# Patient Record
Sex: Male | Born: 1935 | Race: White | Hispanic: No | Marital: Married | Smoking: Former smoker
Health system: Southern US, Community
[De-identification: ages and names within clinical notes are randomized; demographics above are authoritative.]

## PROBLEM LIST (undated history)

## (undated) DIAGNOSIS — N400 Enlarged prostate without lower urinary tract symptoms: Secondary | ICD-10-CM

## (undated) DIAGNOSIS — R269 Unspecified abnormalities of gait and mobility: Secondary | ICD-10-CM

## (undated) DIAGNOSIS — E785 Hyperlipidemia, unspecified: Secondary | ICD-10-CM

## (undated) DIAGNOSIS — I639 Cerebral infarction, unspecified: Secondary | ICD-10-CM

## (undated) DIAGNOSIS — R569 Unspecified convulsions: Secondary | ICD-10-CM

## (undated) HISTORY — PX: ANAL FISSURE REPAIR: SHX2312

## (undated) HISTORY — DX: Unspecified convulsions: R56.9

## (undated) HISTORY — DX: Hyperlipidemia, unspecified: E78.5

## (undated) HISTORY — DX: Unspecified abnormalities of gait and mobility: R26.9

## (undated) HISTORY — DX: Cerebral infarction, unspecified: I63.9

## (undated) HISTORY — DX: Benign prostatic hyperplasia without lower urinary tract symptoms: N40.0

## (undated) HISTORY — PX: CHOLECYSTECTOMY: SHX55

## (undated) HISTORY — PX: HERNIA REPAIR: SHX51

---

## 2006-10-18 ENCOUNTER — Encounter: Admission: RE | Admit: 2006-10-18 | Discharge: 2007-01-16 | Payer: Self-pay | Admitting: *Deleted

## 2007-07-12 ENCOUNTER — Emergency Department (HOSPITAL_COMMUNITY): Admission: EM | Admit: 2007-07-12 | Discharge: 2007-07-13 | Payer: Self-pay | Admitting: Emergency Medicine

## 2008-10-19 IMAGING — CT CT HEAD W/O CM
1 series · 16 of 30 positions shown, 20 images · non-contrast
Comparison: None available

CLINICAL DATA: Seizure

CT HEAD WITHOUT CONTRAST
TECHNIQUE: Contiguous axial images were obtained from the base of
the skull through the vertex without contrast.

[Series 2: (id) head 4.8 h37s st · axial · 0.46mm/px · z∈[-185,-56]mm · 16 of 30 slices shown, 20 images]
[im 2/30  brain]
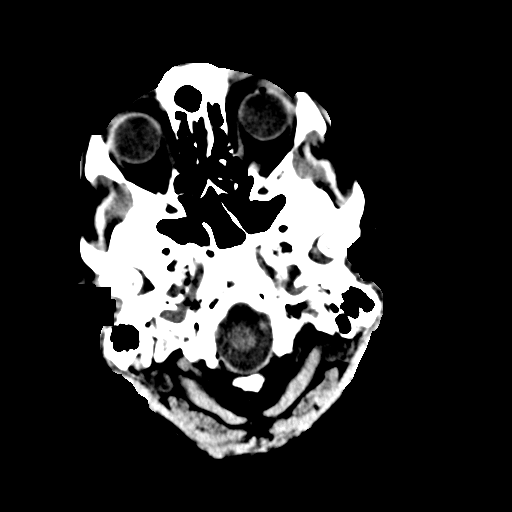
[im 2/30  bone]
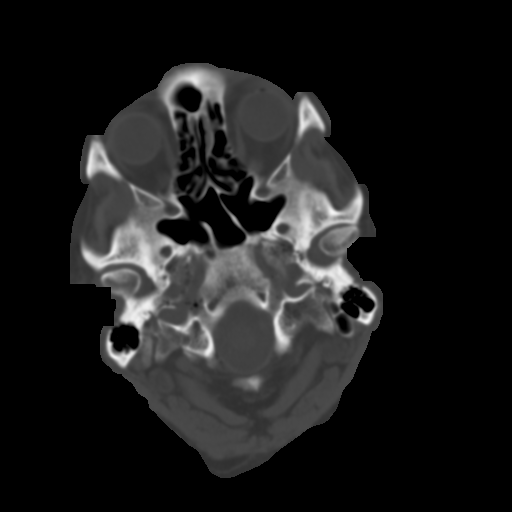
[im 4/30  brain]
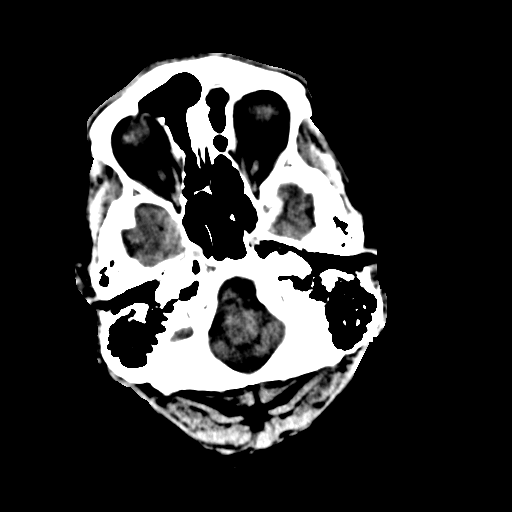
[im 6/30  brain]
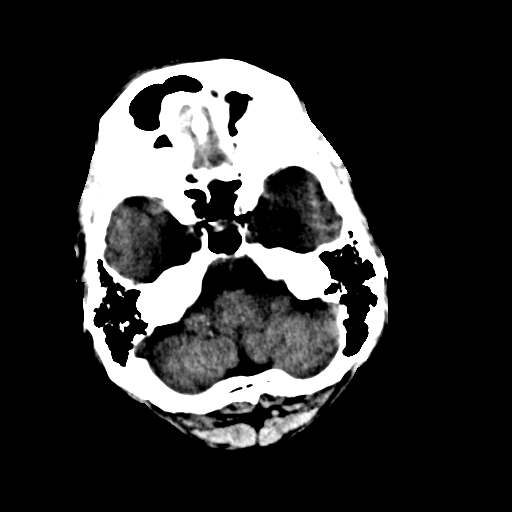
[im 8/30  brain]
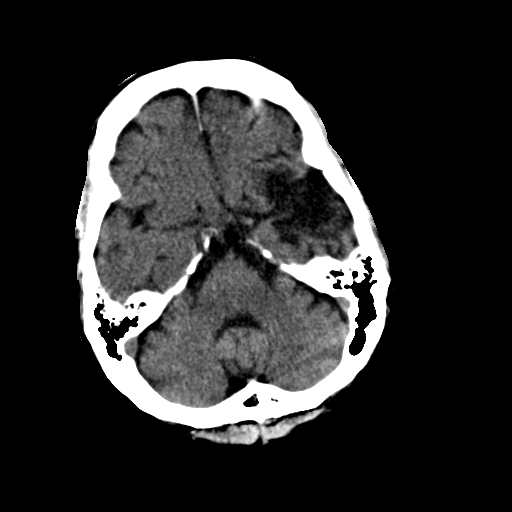
[im 9/30  brain]
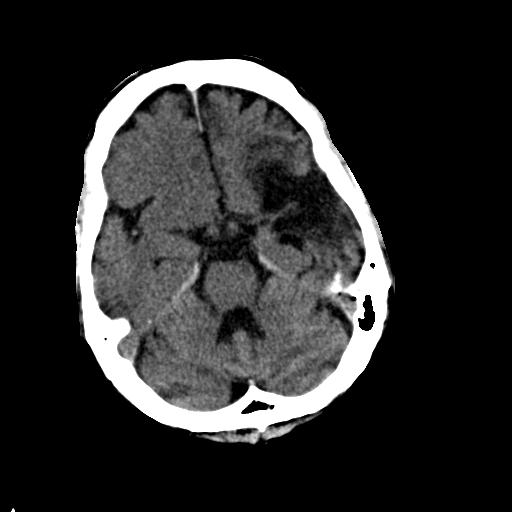
[im 9/30  bone]
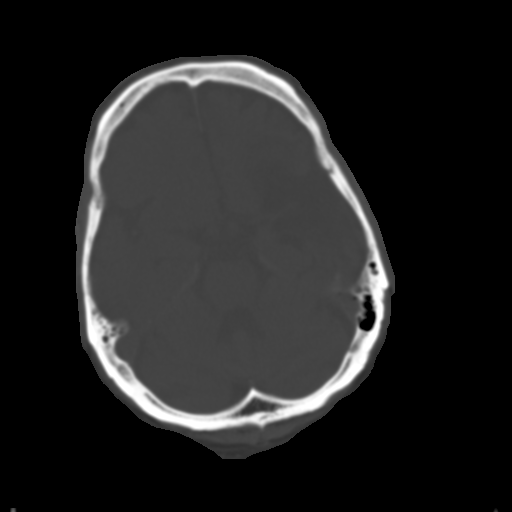
[im 11/30  brain]
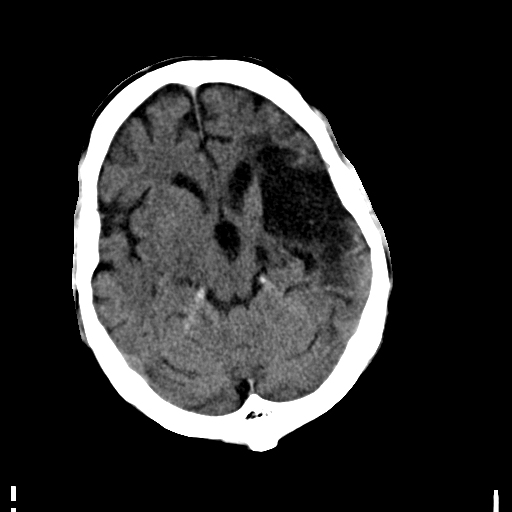
[im 13/30  brain]
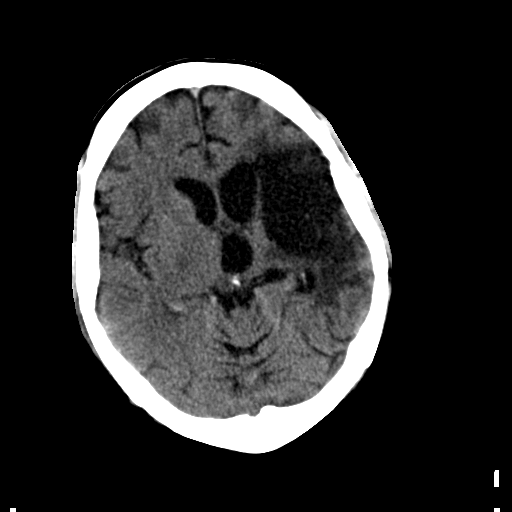
[im 15/30  brain]
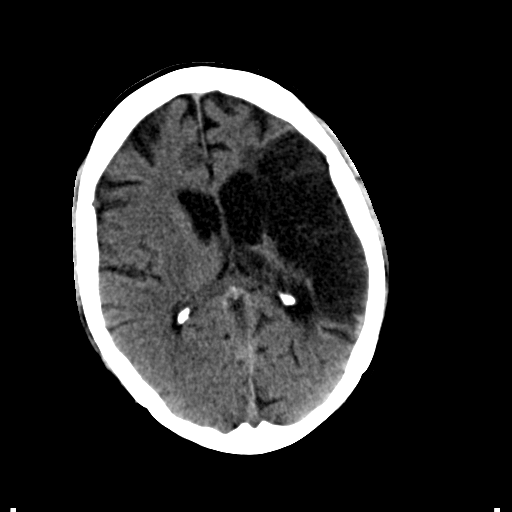
[im 16/30  brain]
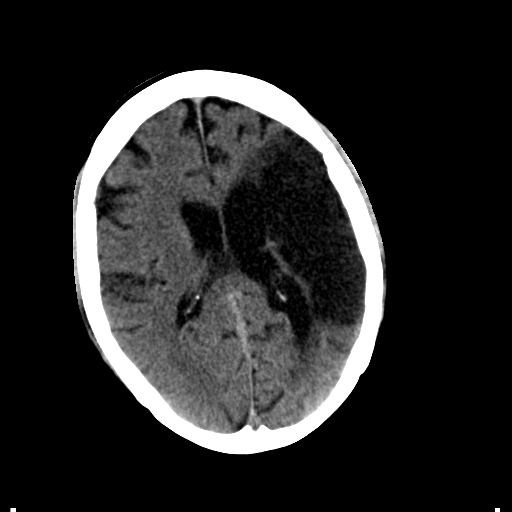
[im 16/30  bone]
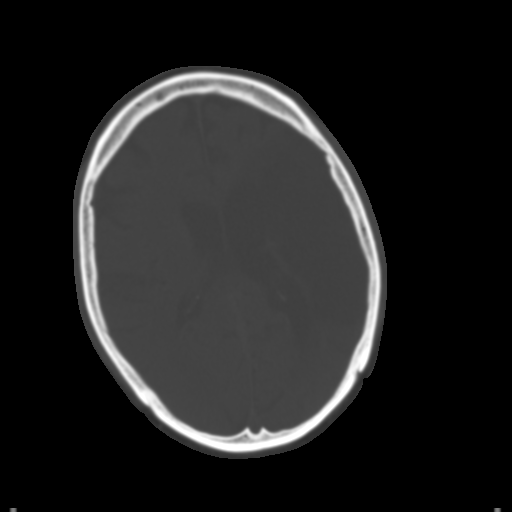
[im 18/30  brain]
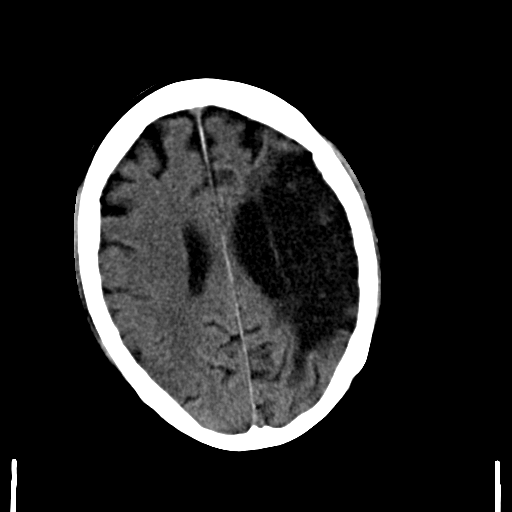
[im 20/30  brain]
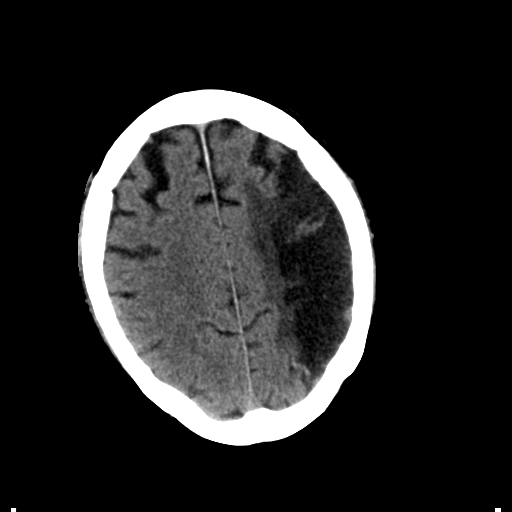
[im 22/30  brain]
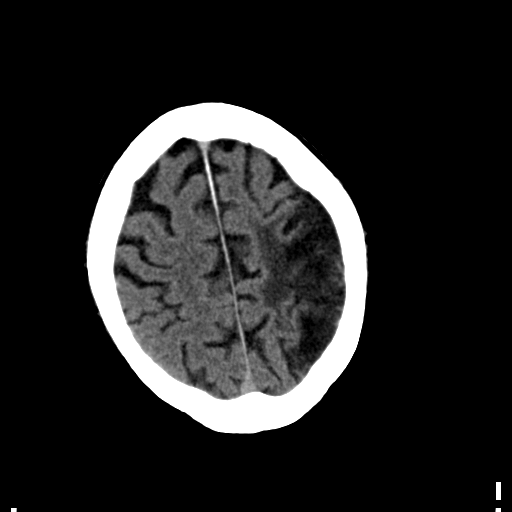
[im 23/30  brain]
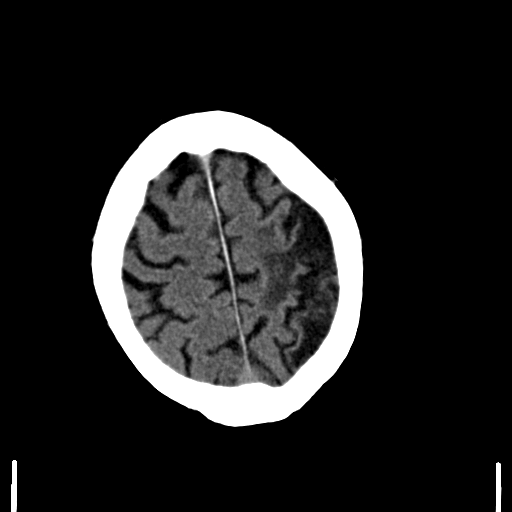
[im 23/30  bone]
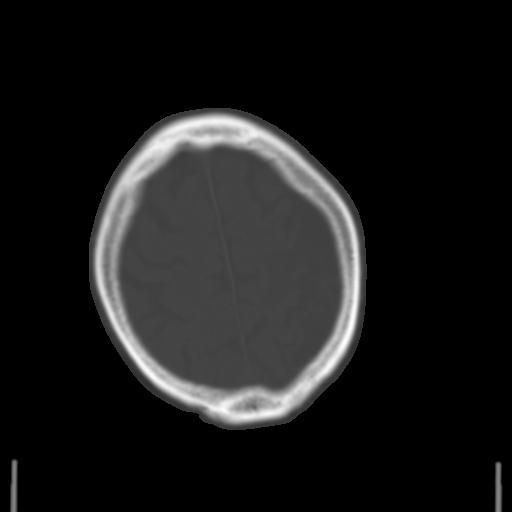
[im 25/30  brain]
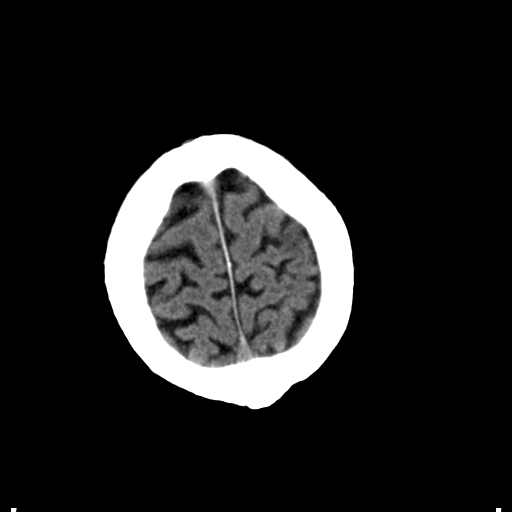
[im 27/30  brain]
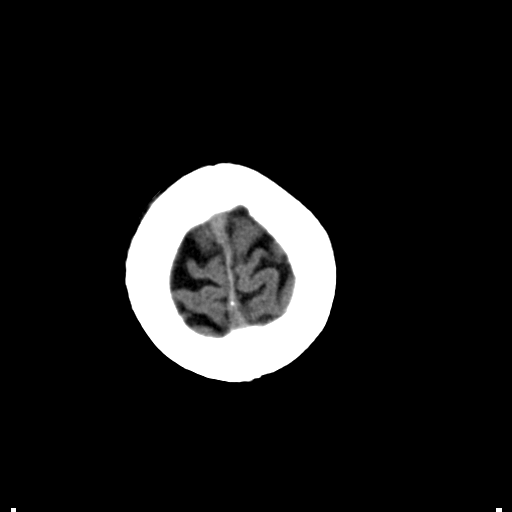
[im 29/30  brain]
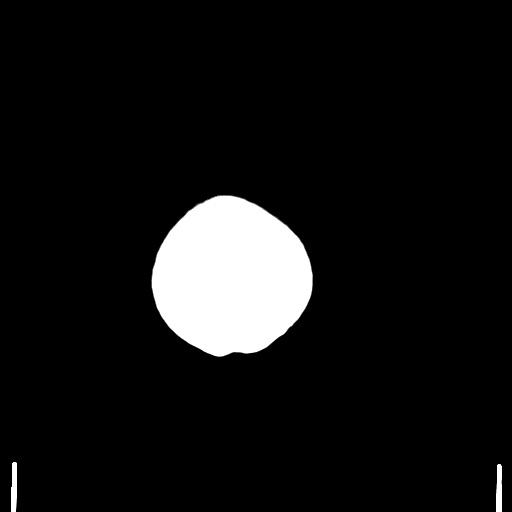

[16 of 30 positions shown; findings below may reference images not displayed]

FINDINGS: Encephalomalacia throughout most of the left MCA
distribution suggesting remote infarct.  There is resultant ex
vacuo dilatation of the adjacent left lateral ventricle.  Mild
diffuse parenchymal atrophy.  Negative for acute intracranial
hemorrhage, midline shift, herniation, or mass lesion.  Acute
infarct may be inapparent on noncontrast CT.  Bone windows show no
focal lesion.
IMPRESSION: 1.  Negative for bleed or other acute intracranial process.
2.  Old left MCA distribution infarct

## 2009-07-18 ENCOUNTER — Emergency Department (HOSPITAL_BASED_OUTPATIENT_CLINIC_OR_DEPARTMENT_OTHER): Admission: EM | Admit: 2009-07-18 | Discharge: 2009-07-18 | Payer: Self-pay | Admitting: Emergency Medicine

## 2009-07-18 ENCOUNTER — Ambulatory Visit: Payer: Self-pay | Admitting: Radiology

## 2010-06-10 LAB — BASIC METABOLIC PANEL
BUN: 13 mg/dL (ref 6–23)
Calcium: 9.1 mg/dL (ref 8.4–10.5)
Creatinine, Ser: 1.1 mg/dL (ref 0.4–1.5)
GFR calc Af Amer: >60 mL/min (ref >60)
Potassium: 3.8 mEq/L (ref 3.5–5.1)

## 2010-06-10 LAB — URINE CULTURE: Culture: NO GROWTH

## 2010-06-10 LAB — URINALYSIS, ROUTINE W REFLEX MICROSCOPIC
Nitrite: NEGATIVE
Protein, ur: NEGATIVE mg/dL
Specific Gravity, Urine: 1.005 (ref 1.005–1.030)

## 2010-08-05 NOTE — Consult Note (Signed)
[name], [name]              ACCOUNT NO.:  [id number]   MEDICAL RECORD NO.:  [id number]          PATIENT TYPE:  EMS   LOCATION:  [facility]                         FACILITY:  [facility]   PHYSICIAN:  Deanna Artis. Hickling, M.D.DATE OF BIRTH:  24-Nov-1933   DATE OF CONSULTATION:  DATE OF DISCHARGE:                                 CONSULTATION   CHIEF COMPLAINT:  Recurrent seizure.   HISTORY OF PRESENT CONDITION:  The patient is a 77 year old gentleman,  who had onset today of rubbing his right arm, followed by clonic  activity of the right arm with secondary generalization.  The entire  episode lasted for about 3 minutes.  The patient had another 10 minutes  of postictal depression.  This occurred around 6 o'clock at night.  The  patient was brought by EMS to [facility], where he was assessed  at 1851 hours.   The patient had a CT scan of the brain which showed a remote left middle  cerebral artery distribution stroke.  The patient had his infarction in  2004 as a result of a left carotid artery occlusion.  He suffered right  hemiparesis and aphasia, but was able to ambulate, though he was not  able to communicate and has limited understanding, was able to function  fairly well at home.   The patient had one other seizure subsequent to his stroke a number of  years ago.  The second occurred tonight.   The patient's risk factors for stroke include hypertension and  dyslipidemia.  He has problems with osteoporosis.  He has had problems  with chronic pain, and receives gabapentin for that.   He is on a variety of other vitamins.   ALLERGIES:  Drug allergies, none known.   REVIEW OF SYSTEMS:  The patient felt well during the day.  His wife saw  him exercising his arm and said that she thought that it might be  somewhat different than he ordinarily moved it.  She wondered if he was  not having sensory symptoms like he did just prior to his seizure.   The patient is able to  swallow.  He is able to ambulate.  He has a  modified Rankin of 2.   CURRENT MEDICATIONS:  1. Aspirin 81 mg daily.  2. Calcium 530 mg daily.  3. Dulcolax 10 mg daily.  4. Fish oil concentrate.  5. Fish oil.  6. Omega-3.  7. Coenzyme daily.  8. Flomax 0.4 mg daily.  9. Gabapentin 100 mg daily.  10.Loratadine, dose unknown daily.  11.Magnesium 250 mg daily.  12.Potassium gluconate 595 mg daily.  13.Pravastatin 40 mg daily.  14.Senna daily as needed.  15.Vitamin B6.  16.Vitamin C.  17.Vitamin E.  18.Vitamin D.  19.Alpha daily.   The patient has not had problems with his heart with atrial  fibrillation.  He has not had diabetes.  He is not a smoker.   I was asked by the patient and recommended that he be given 1000 mg of  Dilantin and then given a prescription for Dilantin 100 mg 3 times  daily.  I was asked to see the patient by Dr. Patrica Duel, who said the patient had a  headache, indeed he had a mild headache, but what was really bothering  him was the IV, the Dilantin ran through in his left hand.  Once I  removed the IV from his hand, he became calm.   PHYSICAL EXAMINATION:  VITAL SIGNS:  Blood pressure 134/71, resting  pulse 71, respirations 16, oxygen saturation 97%, temperature 97.5.  HEAD, EYES, EARS, NOSE AND THROAT:  No signs of infection.  No bruits.  LUNGS:  Clear.  HEART:  No murmurs.  Pulses normal.  ABDOMEN:  Protuberant.  Bowel sounds normal.  EXTREMITIES:  Show spasticity on the right side with contracture of his  flexion contracture at the right elbow, fisted hand, and extension of  the left leg with a tight heel cord.  NEUROLOGIC:  Mental status.  The patient is awake, somewhat agitated, he  calmed down greatly after the IV was removed.  He is able to say go, go,  go, and a few other syllables but makes no sense.  He is not able to  follow commands except by mimic.  Cranial nerves.  Round and reactive  pupils.  He does not appear to neglect visual field  nor he seems to  blink to scare bilaterally.  He has a right central 7th.  He has  dysarthria and severe mixed dysphasia.  Motor examination, spastic right  hemiparesis.  He can lift the right leg against gravity.  He has  difficulty flexing the knee and moving the toes.  He is right-hand  assisted and spasticity keeps it from moving much.  The left side is  entirely normal including fine motor skills.  No drift.  Sensation  appears to be more easily perceived in the right and left side than the  right.  I did not get him up to walk but he has a spastic right gait.   IMPRESSION:  Simple and complex partial seizures (345.40, 345.10).  He  had right brain signature with secondary generalization.   PLAN:  Dilantin 300 mg twice daily.  Prescription was issued today.  We  will see him back in followup with Elveria Rising, RN, to check on his  progress.      Deanna Artis. Sharene Skeans, M.D.  Electronically Signed     [facility]  D:  07/13/2007  T:  07/13/2007  Job:  [id number]   cc:   Gaspar Garbe, M.D.

## 2010-10-09 ENCOUNTER — Encounter: Payer: Self-pay | Admitting: Podiatry

## 2010-10-26 IMAGING — US US EXTREM LOW VENOUS*R*
1 series · 14 of 22 positions shown · non-contrast
Comparison: None.

CLINICAL DATA: Right leg pain.  Hypertension.

RIGHT LOWER EXTREMITY VENOUS DUPLEX ULTRASOUND
TECHNIQUE: Gray-scale sonography with graded compression, as well
as color Doppler and duplex ultrasound, were performed to evaluate
the deep venous system of the lower extremity from the level of the
common femoral vein through the popliteal and proximal calf veins.
Spectral Doppler was utilized to evaluate flow at rest and with
distal augmentation maneuvers.

[Series 1: us extrem low venous*right* · 14 of 22 slices shown]
[im 1/22]
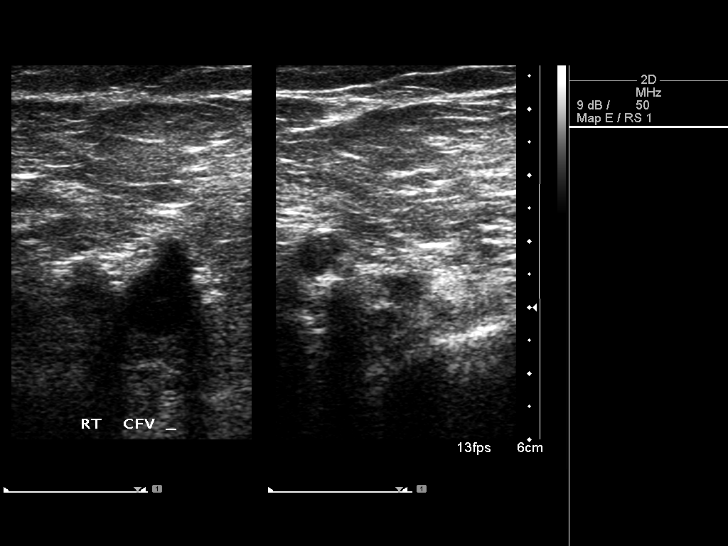
[im 3/22]
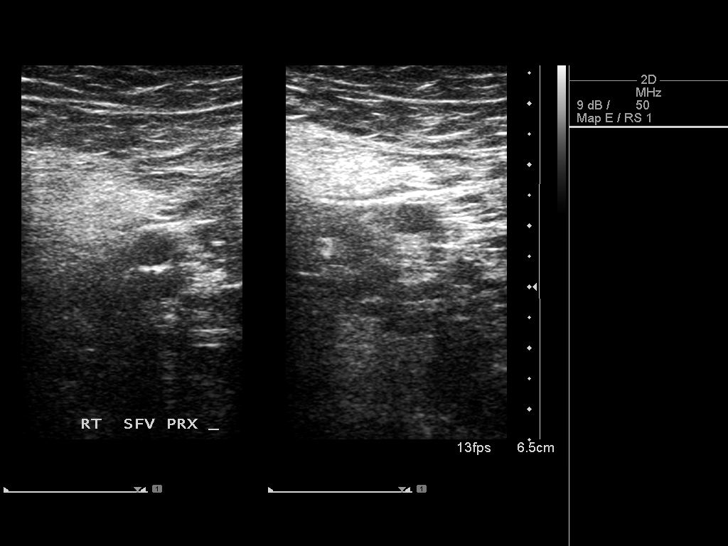
[im 4/22]
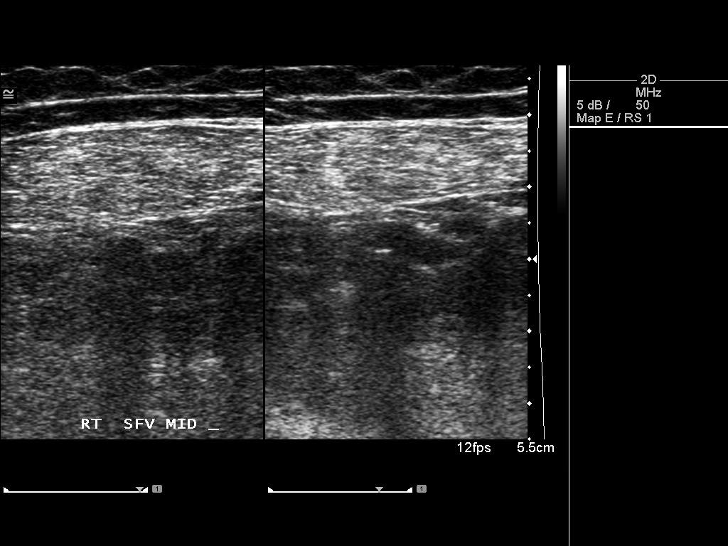
[im 6/22]
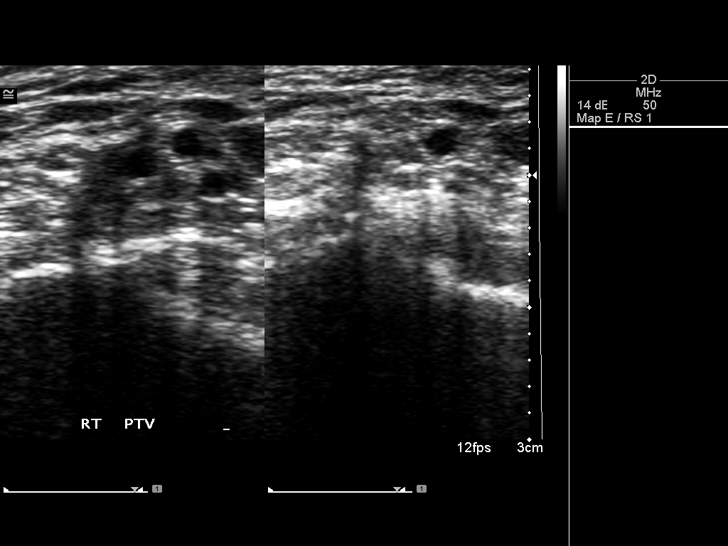
[im 8/22]
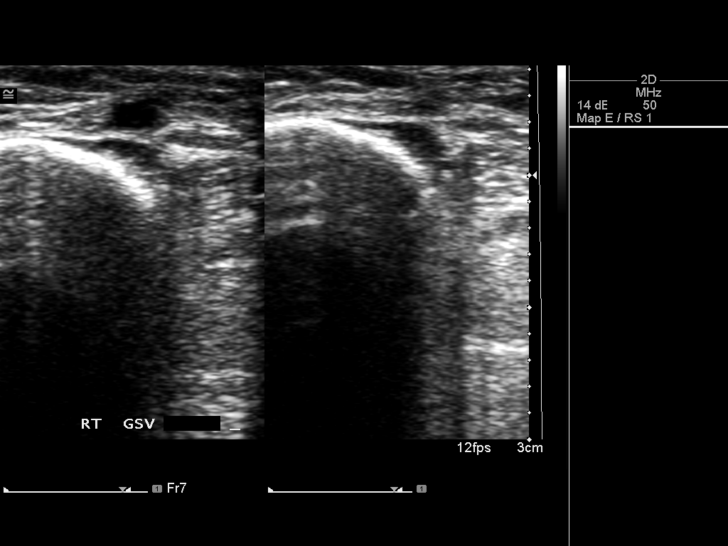
[im 9/22]
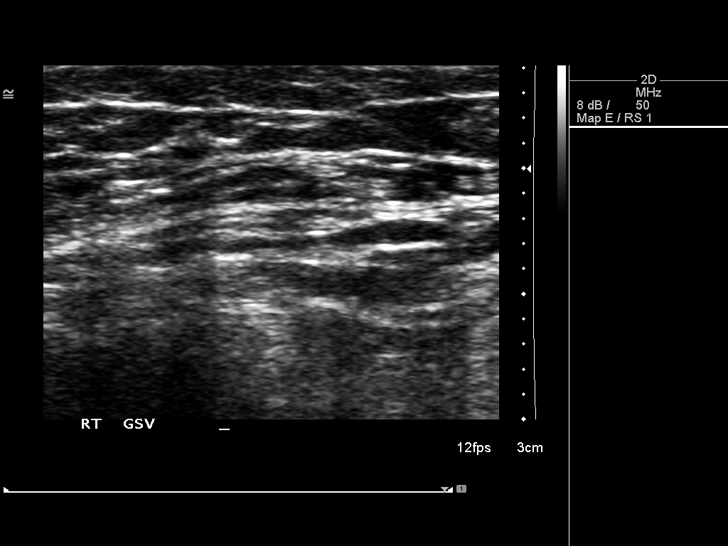
[im 11/22]
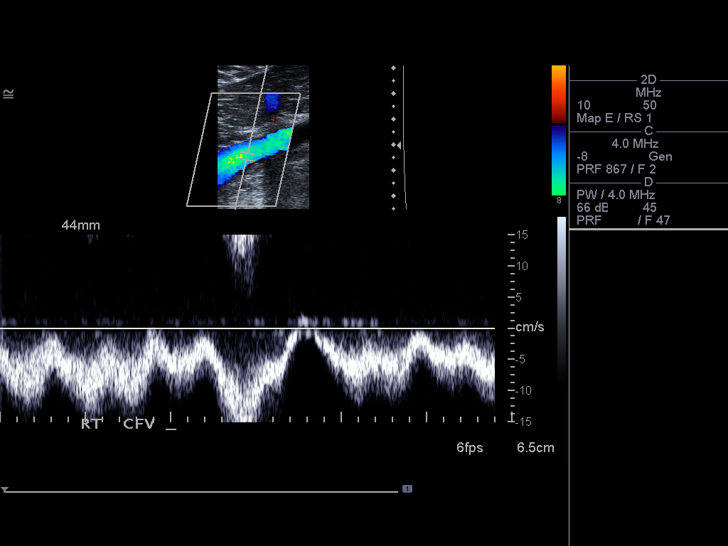
[im 12/22]
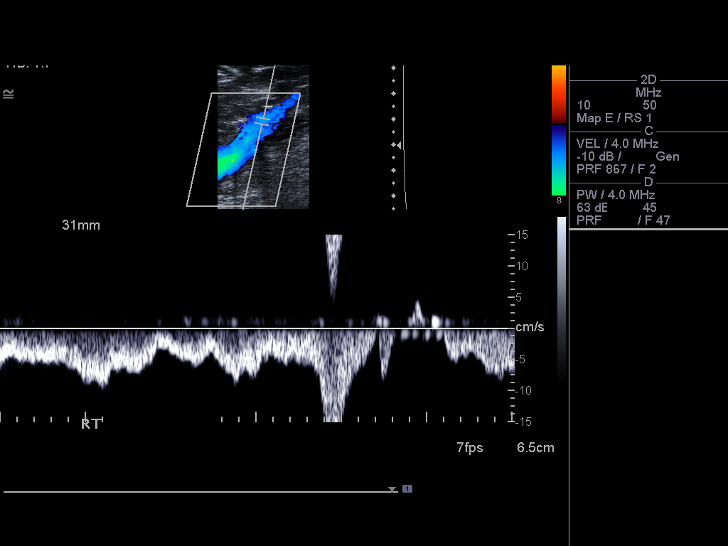
[im 14/22]
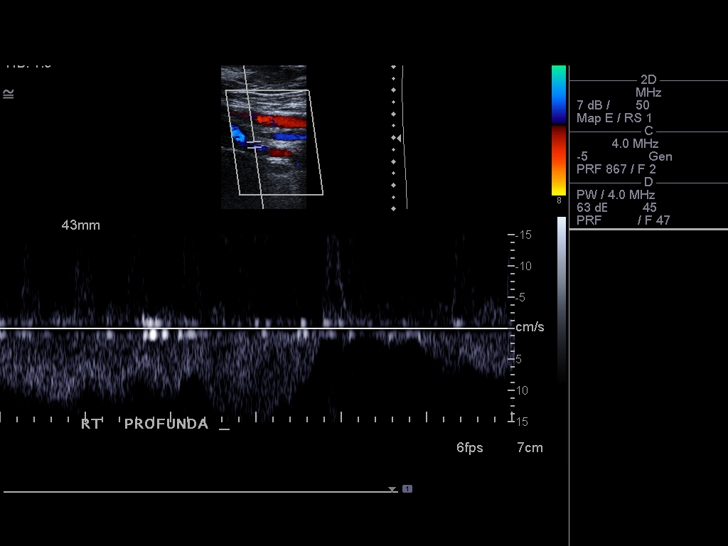
[im 15/22]
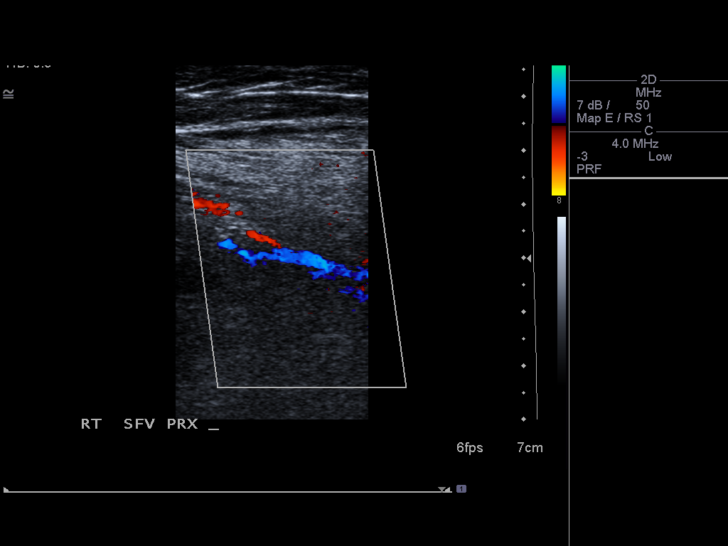
[im 17/22]
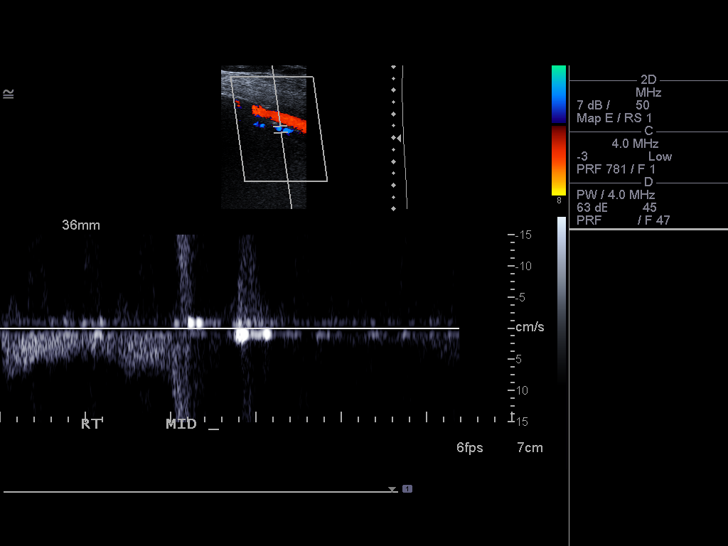
[im 19/22]
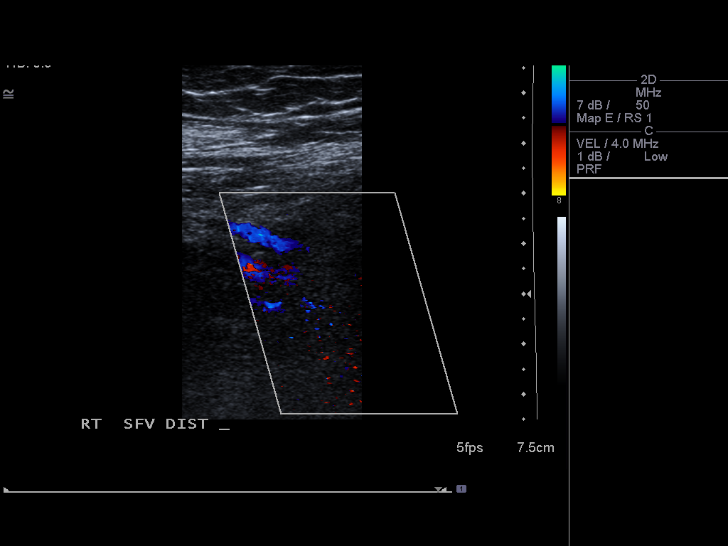
[im 20/22]
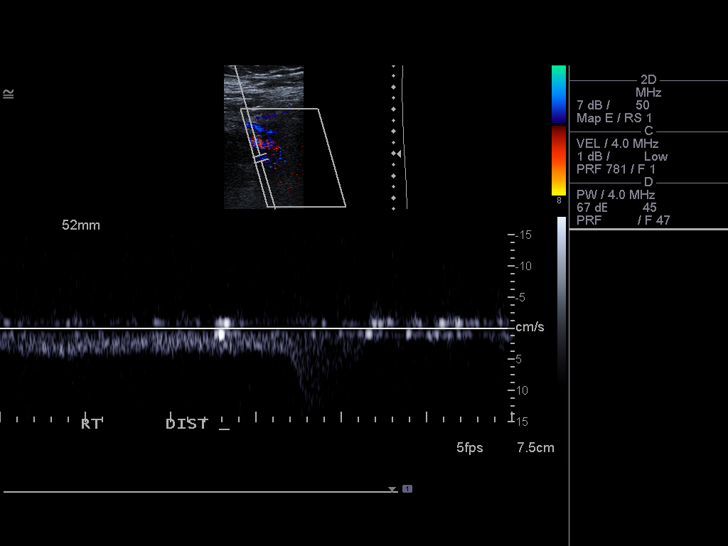
[im 22/22]
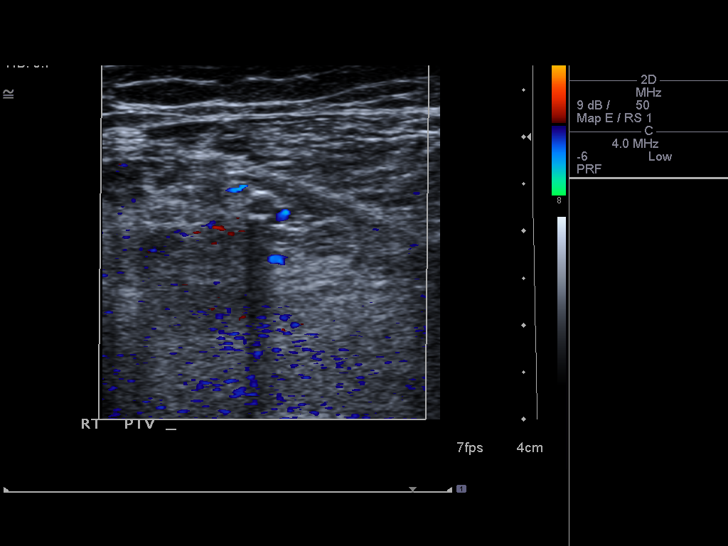

[14 of 22 positions shown; findings below may reference images not displayed]

FINDINGS: The midportion of the superficial femoral vein is not
well visualized.  There is normal compressibility, phasicity, and
augmentation in the common femoral vein, deep femoral vein, and
popliteal vein.  Normal augmentation can be seen both proximal and
distal to the area poorly visualized in the superficial femoral
vein.  This suggests that there is no deep venous thrombosis.
IMPRESSION: No evidence for deep venous thrombosis.

## 2010-12-16 LAB — POCT I-STAT, CHEM 8
BUN: 13
Chloride: 107
Creatinine, Ser: 1.4
Glucose, Bld: 106 — ABNORMAL HIGH
Potassium: 4
Sodium: 143
TCO2: 23

## 2010-12-16 LAB — RAPID URINE DRUG SCREEN, HOSP PERFORMED
Barbiturates: NOT DETECTED
Benzodiazepines: NOT DETECTED
Cocaine: NOT DETECTED

## 2011-08-03 DIAGNOSIS — G40309 Generalized idiopathic epilepsy and epileptic syndromes, not intractable, without status epilepticus: Secondary | ICD-10-CM | POA: Insufficient documentation

## 2011-08-03 DIAGNOSIS — G40209 Localization-related (focal) (partial) symptomatic epilepsy and epileptic syndromes with complex partial seizures, not intractable, without status epilepticus: Secondary | ICD-10-CM | POA: Insufficient documentation

## 2011-08-03 DIAGNOSIS — I69959 Hemiplegia and hemiparesis following unspecified cerebrovascular disease affecting unspecified side: Secondary | ICD-10-CM | POA: Insufficient documentation

## 2011-08-03 DIAGNOSIS — I69920 Aphasia following unspecified cerebrovascular disease: Secondary | ICD-10-CM | POA: Insufficient documentation

## 2012-07-24 ENCOUNTER — Other Ambulatory Visit: Payer: Self-pay | Admitting: Neurology

## 2012-07-27 ENCOUNTER — Encounter: Payer: Self-pay | Admitting: Neurology

## 2012-07-27 DIAGNOSIS — I69920 Aphasia following unspecified cerebrovascular disease: Secondary | ICD-10-CM

## 2012-07-27 DIAGNOSIS — G40309 Generalized idiopathic epilepsy and epileptic syndromes, not intractable, without status epilepticus: Secondary | ICD-10-CM

## 2012-07-27 DIAGNOSIS — I69959 Hemiplegia and hemiparesis following unspecified cerebrovascular disease affecting unspecified side: Secondary | ICD-10-CM

## 2012-07-27 DIAGNOSIS — G40209 Localization-related (focal) (partial) symptomatic epilepsy and epileptic syndromes with complex partial seizures, not intractable, without status epilepticus: Secondary | ICD-10-CM

## 2012-07-28 ENCOUNTER — Ambulatory Visit (INDEPENDENT_AMBULATORY_CARE_PROVIDER_SITE_OTHER): Payer: Medicare Other | Admitting: Neurology

## 2012-07-28 ENCOUNTER — Encounter: Payer: Self-pay | Admitting: Neurology

## 2012-07-28 DIAGNOSIS — I69920 Aphasia following unspecified cerebrovascular disease: Secondary | ICD-10-CM

## 2012-07-28 DIAGNOSIS — G40309 Generalized idiopathic epilepsy and epileptic syndromes, not intractable, without status epilepticus: Secondary | ICD-10-CM

## 2012-07-28 NOTE — Progress Notes (Signed)
Reason for visit: Seizures  [name] is an 79 y.o. male  History of present illness:  [name] is a 79 year old right-handed white male with a history of cerebrovascular disease, and a very large left brain stroke and a right hemiparesis with an aphasia syndrome. The patient has developed seizures secondary to the stroke, and he currently is on Keppra taking 1000 mg twice daily. The patient is tolerating the medication well, without significant drowsiness or irritability. The patient has recently had some issues with sinus drainage, and he currently is on an antibiotic. The patient has not had any seizures since last seen. Occasionally, the patient may have very brief episodes of altered sensorium, without loss of consciousness or jerking. The patient has had some issues with benign prostate enlargement. The patient is on finasteride at this time. The patient returns to this office for an evaluation. His wife is the caretaker, and she recently was diagnosed with cholangiocarcinoma.  Past Medical History  Diagnosis Date  . Stroke     left brain with residual right hemiparesis and aphasia  . Seizures   . Dyslipidemia   . Gait disturbance   . Benign hypertrophy of prostate     Past Surgical History  Procedure Laterality Date  . Cholecystectomy    . Anal fissure repair    . Hernia repair      Family History  Problem Relation Age of Onset  . Diabetes Mother   . Heart disease Mother   . Renal Disease Mother   . COPD Sister   . Heart disease Brother     Social history:  reports that he quit smoking about 54 years ago. He does not have any smokeless tobacco history on file. He reports that  drinks alcohol. He reports that he does not use illicit drugs.  Allergies:  Allergies  Allergen Reactions  . Percodan (Oxycodone-Aspirin)   . Dilantin (Phenytoin Sodium Extended)     Medications:  Current Outpatient Prescriptions on File Prior to Visit  Medication Sig Dispense  Refill  . aspirin 81 MG tablet Take 81 mg by mouth daily.      Marland Kitchen gabapentin (NEURONTIN) 300 MG capsule Take 300 mg by mouth 2 (two) times daily.      Marland Kitchen senna (SENOKOT) 8.6 MG tablet Take 1 tablet by mouth 2 (two) times a week.       No current facility-administered medications on file prior to visit.    ROS:  Out of a complete 14 system review of symptoms, the patient complains only of the following symptoms, and all other reviewed systems are negative.  Fatigue Swelling in the legs Back pain, sinus problems Occasional diarrhea and constipation Urinary urgency Headache, numbness, weakness, speech disturbance Dizziness, seizures, tremor Allergies, runny nose, frequent infections Flushing  There were no vitals taken for this visit.  Physical Exam  General: The patient is alert and cooperative at the time of the examination.  Skin: No significant peripheral edema is noted. The patient wears an AFO brace on the right.   Neurologic Exam  Cranial nerves: Facial symmetry is not present. The patient has a depression of the right nasolabial fold. Speech is aphasic, the patient is only able to say "go-go-go". Extraocular movements are full. Visual fields are notable for a dense right homonymous hemianopsia.  Motor: The patient has good strength in the left extremities. The patient carries the right arm in flexion, with incomplete abduction of the arm. 4 out 5 strength is seen with flexion  and extension at the elbow. The patient keeps the fingers in flexion. With the right leg, the patient again has 4/5 strength, has an AFO brace on the right ankle.  Coordination: The patient has good finger-nose-finger and heel-to-shin on the left, difficult to perform on the right.  Gait and station: The patient was in a wheelchair, he was not ambulated today.  Reflexes: Deep tendon reflexes are symmetric, but are depressed.   Assessment/Plan:  1. History seizures  2. Cerebrovascular disease,  left brain stroke  The patient has done well with the seizures. The patient continues to have a significant neurologic deficit from the stroke with a right hemiparesis, and a severe aphasia syndrome. The patient is able to ambulate short distances, and he wears a brace on his right ankle. The patient does have some occasional right leg discomfort. The patient will be maintained on Keppra taking 1000 mg twice daily. The patient will followup in one year. The patient may need to see an ear nose and throat physician for the sinus issues.  Marlan Palau MD 07/28/2012 7:44 PM  [facility] [address] [place], [place] [address]  Phone [phone] Fax [phone]

## 2012-10-24 ENCOUNTER — Other Ambulatory Visit: Payer: Self-pay | Admitting: Neurology

## 2013-10-15 ENCOUNTER — Other Ambulatory Visit: Payer: Self-pay | Admitting: Neurology

## 2013-11-26 ENCOUNTER — Other Ambulatory Visit: Payer: Self-pay | Admitting: Neurology

## 2013-12-26 ENCOUNTER — Encounter: Payer: Self-pay | Admitting: Neurology

## 2013-12-26 ENCOUNTER — Ambulatory Visit (INDEPENDENT_AMBULATORY_CARE_PROVIDER_SITE_OTHER): Payer: Medicare Other | Admitting: Neurology

## 2013-12-26 VITALS — BP 155/90 | HR 87 | Ht 70.0 in | Wt 194.0 lb

## 2013-12-26 DIAGNOSIS — G40209 Localization-related (focal) (partial) symptomatic epilepsy and epileptic syndromes with complex partial seizures, not intractable, without status epilepticus: Secondary | ICD-10-CM

## 2013-12-26 DIAGNOSIS — I69959 Hemiplegia and hemiparesis following unspecified cerebrovascular disease affecting unspecified side: Secondary | ICD-10-CM

## 2013-12-26 DIAGNOSIS — G8190 Hemiplegia, unspecified affecting unspecified side: Secondary | ICD-10-CM

## 2013-12-26 MED ORDER — LEVETIRACETAM 1000 MG PO TABS: 1000.0000 mg | ORAL_TABLET | Freq: Two times a day (BID) | ORAL | Status: DC

## 2013-12-26 NOTE — Progress Notes (Signed)
Reason for visit: History of stroke, seizures  [name] is an 80 y.o. male  History of present illness:  [name] is a 80 year old right-handed white male with a history of a left brain stroke event associated with a residual right hemiparesis and aphasia. The patient is able to ambulate a few steps, transfer to the toilet and back to the wheelchair. The patient mobilizes in a wheelchair, using his left leg. The patient can feed himself, but he otherwise requires assistance with all other activities of daily living. The patient does have a history of seizures that is associated with transient altered consciousness. The patient occasionally will have "bouncing" of the right leg when he puts pressure on the foot. The patient recently had an episode of tremor involving the right upper extremity associated with feeling cold or chilly. There was no alteration in consciousness, and no spread to the face or to the leg. He is on Keppra, and he is tolerating the medication well. He was feeling poorly with generalized weakness prior to May of 2015. He was discovered to have problems with aspiration pneumonia, and he had an evaluation through a speech therapist with a modified barium swallow. The patient has been placed on nectar thick liquids, and he was told to sit upright when eating. This has helped significantly, and his energy level has returned. The patient comes to this office for an evaluation.   Past Medical History  Diagnosis Date  . Stroke     left brain with residual right hemiparesis and aphasia  . Seizures   . Dyslipidemia   . Gait disturbance   . Benign hypertrophy of prostate     Past Surgical History  Procedure Laterality Date  . Cholecystectomy    . Anal fissure repair    . Hernia repair      Family History  Problem Relation Age of Onset  . Diabetes Mother   . Heart disease Mother   . Renal Disease Mother   . COPD Sister   . Heart disease Brother     Social  history:  reports that he quit smoking about 55 years ago. He has never used smokeless tobacco. He reports that he drinks alcohol. He reports that he does not use illicit drugs.    Allergies  Allergen Reactions  . Oxycodone-Acetaminophen     Other reaction(s): Dizziness  . Percodan [Oxycodone-Aspirin]   . Dilantin [Phenytoin Sodium Extended]     Medications:  Current Outpatient Prescriptions on File Prior to Visit  Medication Sig Dispense Refill  . acetaminophen (TYLENOL) 500 MG tablet Take 500 mg by mouth every 6 (six) hours as needed for pain.      Marland Kitchen ALPRAZolam (XANAX) 0.25 MG tablet Take 0.25 mg by mouth daily.      Marland Kitchen aspirin 81 MG tablet Take 81 mg by mouth daily.      . bisacodyl (DULCOLAX) 10 MG suppository Place 10 mg rectally as needed for constipation.      . Calcium Citrate-Vitamin D (CALCIUM CITRATE + PO) Take 400 mg by mouth daily.      . finasteride (PROSCAR) 5 MG tablet Take 5 mg by mouth daily.      Marland Kitchen gabapentin (NEURONTIN) 300 MG capsule Take 300 mg by mouth 2 (two) times daily.      Marland Kitchen Loratadine-Pseudoephedrine (PX ALLERGY RELIEF D, LORATID, PO) Take 10 mg by mouth as needed.       . polyethylene glycol (MIRALAX / GLYCOLAX) packet Take 17  g by mouth daily.      Marland Kitchen. senna (SENOKOT) 8.6 MG tablet Take 1 tablet by mouth as needed.        No current facility-administered medications on file prior to visit.    ROS:  Out of a complete 14 system review of symptoms, the patient complains only of the following symptoms, and all other reviewed systems are negative.  Activity change Ear pain Difficulty swallowing History of aspiration pneumonia Cold intolerance Sleep apnea, snoring Frequency of urination, urinary urgency Muscle cramps  Blood pressure 155/90, pulse 87, height 5\' 10"  (1.778 m), weight 194 lb (87.998 kg).  Physical Exam  General: The patient is alert and cooperative at the time of the examination.  Skin: No significant peripheral edema is  noted.   Neurologic Exam  Mental status: The patient is aphasic, alert and cooperative.  Cranial nerves: Facial symmetry is present. Speech is aphasic, he will repeat the phrase "go go go". He will follow some care verbal commands Extraocular movements are full. Visual fields are notable for decreased blink from the right to threat, normal on the left.  Motor: The patient has good strength in the left extremities. The patient has increased motor tone on the right arm and right leg, 3/5 strength with grip, flexion and extension at the elbow on the right. The patient is unable to elevate the right arm above the head. The patient has minimal voluntary movement of the right leg. The patient wears an AFO brace on the right.  Sensory examination: The patient reports decreased sensation on the right face, arm, and leg.  Coordination: The patient has good finger-nose-finger and heel-to-shin on the left, unable to perform on the right..  Gait and station: The patient was not ambulated, he is wheelchair-bound.  Reflexes: Deep tendon reflexes are brisk on the right arm and right leg.   Assessment/Plan:  One. Cerebrovascular disease, left brain stroke  2. Focal seizures, left brain  3. Dysphagia, aspiration pneumonia  4. Gait disorder  The patient has done better with modification of the diet, and he has returned to his normal energy level, and functional level. The patient will continue the Keppra for now, a prescription was written for this. He will followup in about 6 months. The patient has had some episodes of tremor involving the right arm, this likely is associated with feeling cold, but if these episodes become more frequent, and are associated with alteration of consciousness, the wife will contact our office.  Marlan Palau. Keith Kimmerly Lora MD 12/26/2013 7:47 PM  [facility] [address] [place], [place] [address]  Phone [phone] Fax [phone]

## 2013-12-26 NOTE — Patient Instructions (Signed)

## 2014-02-07 ENCOUNTER — Encounter: Payer: Self-pay | Admitting: Neurology

## 2014-02-13 ENCOUNTER — Encounter: Payer: Self-pay | Admitting: Neurology

## 2014-06-27 ENCOUNTER — Encounter: Payer: Self-pay | Admitting: Adult Health

## 2014-06-27 ENCOUNTER — Ambulatory Visit (INDEPENDENT_AMBULATORY_CARE_PROVIDER_SITE_OTHER): Payer: Medicare Other | Admitting: Adult Health

## 2014-06-27 VITALS — BP 183/83 | HR 68

## 2014-06-27 DIAGNOSIS — G8190 Hemiplegia, unspecified affecting unspecified side: Secondary | ICD-10-CM

## 2014-06-27 DIAGNOSIS — I69959 Hemiplegia and hemiparesis following unspecified cerebrovascular disease affecting unspecified side: Secondary | ICD-10-CM

## 2014-06-27 DIAGNOSIS — I69920 Aphasia following unspecified cerebrovascular disease: Secondary | ICD-10-CM

## 2014-06-27 DIAGNOSIS — G40209 Localization-related (focal) (partial) symptomatic epilepsy and epileptic syndromes with complex partial seizures, not intractable, without status epilepticus: Secondary | ICD-10-CM | POA: Diagnosis not present

## 2014-06-27 NOTE — Progress Notes (Signed)
[id number]  PATIENT: [name] DOB: 21-Feb-1934  REASON FOR VISIT: follow up-stroke  and seizures HISTORY FROM: patient  HISTORY OF PRESENT ILLNESS: 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 He returns today for follow-up. The patient continues to take Keppra for his seizures. He denies any seizure events. The patient no longer ambulates. Wife states that directly after the seizure he was walking some however he developed leg pain and spasms and became weak. He now mobilizes via a wheelchair. Wife states that his systolic blood pressure usually runs 160- 170 in the afternoons. If it is greater than 160 she gives the patient clonidine. Wife states that primary care is aware of the blood pressure issues. Patient was hospitalized in November for acute renal failure due to dehydration. She states while hospitalized they did do a repeat swallow evaluation and the patient passed. He is no longer on thickened liquids. The patient was also in the hospital in March due to dental surgery he stayed overnight due to low oxygen levels. Since then the patient is doing well. Denies any new neurological symptoms. He returns today for evaluation.  HISTORY 07/28/13 (WILLIS): [name] is a 81 year old right-handed white male with a history of a left brain stroke event associated with a residual right hemiparesis and aphasia. The patient is able to ambulate a few steps, transfer to the toilet and back to the wheelchair. The patient mobilizes in a wheelchair, using his left leg. The patient can feed himself, but he otherwise requires assistance with all other activities of daily living. The patient does have a history of seizures that is associated with transient altered consciousness. The patient occasionally will have "bouncing" of the right leg when he puts pressure on the foot. The patient recently had an  episode of tremor involving the right upper extremity associated with feeling cold or chilly. There was no alteration in consciousness, and no spread to the face or to the leg. He is on Keppra, and he is tolerating the medication well. He was feeling poorly with generalized weakness prior to May of 2015. He was discovered to have problems with aspiration pneumonia, and he had an evaluation through a speech therapist with a modified barium swallow. The patient has been placed on nectar thick liquids, and he was told to sit upright when eating. This has helped significantly, and his energy level has returned. The patient comes to this office for an evaluation.   REVIEW OF SYSTEMS: Out of a complete 14 system review of symptoms, the patient complains only of the following symptoms, and all other reviewed systems are negative.  See HPI  ALLERGIES: Allergies  Allergen Reactions  . Oxycodone-Acetaminophen     Other reaction(s): Dizziness  . Percodan [Oxycodone-Aspirin]   . Dilantin [Phenytoin Sodium Extended]     HOME MEDICATIONS: Outpatient Prescriptions Prior to Visit  Medication Sig Dispense Refill  . acetaminophen (TYLENOL) 500 MG tablet Take 500 mg by mouth every 6 (six) hours as needed for pain.    Marland Kitchen ALPRAZolam (XANAX) 0.25 MG tablet Take 0.25 mg by mouth daily.    Marland Kitchen aspirin 81 MG tablet Take 81 mg by mouth daily.    . bisacodyl (DULCOLAX) 10 MG suppository Place 10 mg rectally as needed for constipation.    . Calcium Citrate-Vitamin D (CALCIUM CITRATE + PO) Take 400 mg by mouth daily.    . finasteride (PROSCAR) 5  MG tablet Take 5 mg by mouth daily.    Marland Kitchen. gabapentin (NEURONTIN) 300 MG capsule Take 300 mg by mouth 2 (two) times daily.    Marland Kitchen. guaiFENesin (MUCINEX) 600 MG 12 hr tablet Take 1,200 mg by mouth 2 (two) times daily as needed.    . hydrALAZINE (APRESOLINE) 50 MG tablet Take 50 mg by mouth 3 (three) times daily.    Marland Kitchen. levETIRAcetam (KEPPRA) 1000 MG tablet Take 1 tablet (1,000 mg total)  by mouth 2 (two) times daily. 180 tablet 3  . Loratadine-Pseudoephedrine (PX ALLERGY RELIEF D, LORATID, PO) Take 10 mg by mouth as needed.     Marland Kitchen. losartan (COZAAR) 50 MG tablet Take 50 mg by mouth daily.    . polyethylene glycol (MIRALAX / GLYCOLAX) packet Take 17 g by mouth daily.    Marland Kitchen. senna (SENOKOT) 8.6 MG tablet Take 1 tablet by mouth as needed.      No facility-administered medications prior to visit.    PAST MEDICAL HISTORY: Past Medical History  Diagnosis Date  . Stroke     left brain with residual right hemiparesis and aphasia  . Seizures   . Dyslipidemia   . Gait disturbance   . Benign hypertrophy of prostate     PAST SURGICAL HISTORY: Past Surgical History  Procedure Laterality Date  . Cholecystectomy    . Anal fissure repair    . Hernia repair      FAMILY HISTORY: Family History  Problem Relation Age of Onset  . Diabetes Mother   . Heart disease Mother   . Renal Disease Mother   . COPD Sister   . Heart disease Brother     SOCIAL HISTORY: History   Social History  . Marital Status: Married    Spouse Name: N/A  . Number of Children: 0  . Years of Education: 2-degree   Occupational History  . Retired    Social History Main Topics  . Smoking status: Former Smoker    Quit date: 03/23/1958  . Smokeless tobacco: Never Used  . Alcohol Use: Yes     Comment: Consumes alcohol on occasion  . Drug Use: No  . Sexual Activity: Not on file   Other Topics Concern  . Not on file   Social History Narrative  . No narrative on file      PHYSICAL EXAM  Filed Vitals:   06/27/14 1450  BP: 183/83  Pulse: 68   Cannot calculate BMI with a height equal to zero.  Generalized: Well developed, in no acute distress   Neurological examination  Mentation: Alert. Expressive aphasia Cranial nerve II-XII: Pupils were equal round reactive to light. Extraocular movements were full, visual field were full on confrontational test. Facial sensation and strength were  normal. Uvula tongue midline. Head turning and shoulder shrug  were normal and symmetric. Motor: The motor testing reveals 5 over 5 strength in the left upper and lower 70. 3 over 5 strength in the right upper extremity and 2 over 5 strength in the right lower extremity. Good symmetric motor tone is noted throughout.  Sensory: Sensory testing is intact to soft touch on all 4 extremities. No evidence of extinction is noted.  Gait and station: wheelchair  Reflexes: Deep tendon reflexes are symmetric and normal bilaterally.    DIAGNOSTIC DATA (LABS, IMAGING, TESTING) - I reviewed patient records, labs, notes, testing and imaging myself where available.    ASSESSMENT AND PLAN 81 y.o. year old male  has a past medical history  of Stroke; Seizures; Dyslipidemia; Gait disturbance; and Benign hypertrophy of prostate. here with:  1. History of stroke 2. Seizures  Overall the patient has remained stable. Patient advised to continue taking Keppra for seizures. If he has any additional seizure events he should let us know. Patient should continue taking aspirin for stroke prevention. Patient advised to follow-up with his primary care provider regarding his blood pressure. Wife verbalized understanding. If he has any strokelike symptoms he should go to the emergency room immediately. Otherwise he will follow-up with [place] in 1 year or sooner if needed.     Butch Penny, MSN, NP-C 06/27/2014, 2:48 PM [facility] [address] [place], [place] [address] [phone]  Note: This document was prepared with digital dictation and possible smart phrase technology. Any transcriptional errors that result from this process are unintentional.

## 2014-06-27 NOTE — Patient Instructions (Signed)
Continue Keppra. Maintain strict control of your blood pressure and cholesterol.  Follow-up with PCP regarding increase in BP.

## 2014-06-27 NOTE — Progress Notes (Signed)
I have read the note, and I agree with the clinical assessment and plan.  WILLIS,CHARLES KEITH   

## 2015-01-08 ENCOUNTER — Telehealth: Payer: Self-pay | Admitting: Neurology

## 2015-01-08 MED ORDER — GABAPENTIN 100 MG PO CAPS: 200.0000 mg | ORAL_CAPSULE | Freq: Two times a day (BID) | ORAL | Status: DC

## 2015-01-08 NOTE — Telephone Encounter (Signed)
Pt's wife has called requesting a Rx refill on gabapentin (NEURONTIN) is asking 100mg  2 in the morning then 2 at night. She states that pts PCP took over this medication, however they have gone out of business and pt needs refill. They are the process of finding a new PCP but pt has a gap with the medication

## 2015-01-08 NOTE — Telephone Encounter (Signed)
I will call in a prescription.

## 2015-02-04 ENCOUNTER — Other Ambulatory Visit: Payer: Self-pay | Admitting: Neurology

## 2015-05-04 ENCOUNTER — Other Ambulatory Visit: Payer: Self-pay | Admitting: Neurology

## 2015-07-18 ENCOUNTER — Ambulatory Visit: Payer: Medicare Other | Admitting: Adult Health

## 2015-08-01 ENCOUNTER — Other Ambulatory Visit: Payer: Self-pay | Admitting: Neurology

## 2015-08-15 ENCOUNTER — Encounter: Payer: Self-pay | Admitting: Adult Health

## 2015-08-15 ENCOUNTER — Ambulatory Visit (INDEPENDENT_AMBULATORY_CARE_PROVIDER_SITE_OTHER): Payer: Medicare Other | Admitting: Adult Health

## 2015-08-15 ENCOUNTER — Other Ambulatory Visit: Payer: Self-pay | Admitting: Adult Health

## 2015-08-15 VITALS — BP 168/79 | HR 46 | Ht 70.0 in

## 2015-08-15 DIAGNOSIS — R569 Unspecified convulsions: Secondary | ICD-10-CM | POA: Diagnosis not present

## 2015-08-15 DIAGNOSIS — Z8673 Personal history of transient ischemic attack (TIA), and cerebral infarction without residual deficits: Secondary | ICD-10-CM

## 2015-08-15 DIAGNOSIS — G8191 Hemiplegia, unspecified affecting right dominant side: Secondary | ICD-10-CM

## 2015-08-15 NOTE — Progress Notes (Signed)
I have read the note, and I agree with the clinical assessment and plan.  WILLIS,CHARLES KEITH   

## 2015-08-15 NOTE — Patient Instructions (Signed)
Continue Aspirin Blood Pressure goal <130/90 Cholesterol LDL <100 HbgA1c <6.5 %

## 2015-08-15 NOTE — Progress Notes (Signed)
PATIENT: [name] DOB: Sep 20, 1933  REASON FOR VISIT: follow up HISTORY FROM: patient  HISTORY OF PRESENT ILLNESS: [name] is an 82 year old male with a history of left brain stroke with residual right hemiparesis and aphasia as well as history of seizures. He returns today for follow-up. His primary care is managing his hypertension. He has been started on new HTN medications. He is no longer uses clonidine. The patient's wife reports that his LDL has been elevated but she is resistant to putting him on statins due to potential muscle aches. The patient reports that he has not had any additional seizure events. He remains on Keppra 1000 mg twice a day. The patient requires minimal assistance with ADLS. Right sided hemiparesis from stroke as well as asphasia. He does ambulate via a wheelchair. He does not operate a motor vehicle. He returns today for an evaluation.  HISTORY 06/27/14: [name] is a 82 year old male with a history of left brain stroke with residual right hemiparesis and aphasia as well as a history of seizures. He returns today for follow-up. The patient continues to take Keppra for his seizures. He denies any seizure events. The patient no longer ambulates. Wife states that directly after the seizure he was walking some however he developed leg pain and spasms and became weak. He now mobilizes via a wheelchair. Wife states that his systolic blood pressure usually runs 160- 170 in the afternoons. If it is greater than 160 she gives the patient clonidine. Wife states that primary care is aware of the blood pressure issues. Patient was hospitalized in November for acute renal failure due to dehydration. She states while hospitalized they did do a repeat swallow evaluation and the patient passed. He is no longer on thickened liquids. The patient was also in the hospital in March due to dental surgery he stayed overnight due to low oxygen levels. Since then the patient is doing  well. Denies any new neurological symptoms. He returns today for evaluation.  HISTORY 07/28/13 (WILLIS): [name] is a 82 year old right-handed white male with a history of a left brain stroke event associated with a residual right hemiparesis and aphasia. The patient is able to ambulate a few steps, transfer to the toilet and back to the wheelchair. The patient mobilizes in a wheelchair, using his left leg. The patient can feed himself, but he otherwise requires assistance with all other activities of daily living. The patient does have a history of seizures that is associated with transient altered consciousness. The patient occasionally will have "bouncing" of the right leg when he puts pressure on the foot. The patient recently had an episode of tremor involving the right upper extremity associated with feeling cold or chilly. There was no alteration in consciousness, and no spread to the face or to the leg. He is on Keppra, and he is tolerating the medication well. He was feeling poorly with generalized weakness prior to May of 2015. He was discovered to have problems with aspiration pneumonia, and he had an evaluation through a speech therapist with a modified barium swallow. The patient has been placed on nectar thick liquids, and he was told to sit upright when eating. This has helped significantly, and his energy level has returned. The patient comes to this office for an evaluation.   REVIEW OF SYSTEMS: Out of a complete 14 system review of symptoms, the patient complains only of the following symptoms, and all other reviewed systems are negative.  Cough, chills occasionally, runny  nose, aching muscles, muscle cramps  ALLERGIES: Allergies  Allergen Reactions  . Oxycodone-Acetaminophen     Other reaction(s): Dizziness, felt like was going to faint  . Percodan [Oxycodone-Aspirin]   . Dilantin [Phenytoin Sodium Extended]     Itching and rash    HOME MEDICATIONS: Outpatient Prescriptions  Prior to Visit  Medication Sig Dispense Refill  . acetaminophen (TYLENOL) 500 MG tablet Take 500 mg by mouth every 6 (six) hours as needed for pain.    Marland Kitchen ALPRAZolam (XANAX) 0.25 MG tablet Take 0.25 mg by mouth at bedtime as needed.     Marland Kitchen aspirin 81 MG tablet Take 81 mg by mouth daily.    . bisacodyl (DULCOLAX) 10 MG suppository Place 10 mg rectally as needed for constipation.    . Calcium Citrate-Vitamin D (CALCIUM CITRATE + PO) Take 400 mg by mouth daily.    Marland Kitchen CLONIDINE HCL PO Take by mouth as directed.    . finasteride (PROSCAR) 5 MG tablet Take 5 mg by mouth daily.    Marland Kitchen gabapentin (NEURONTIN) 100 MG capsule TAKE 2 CAPSULES (200 MG TOTAL) BY MOUTH 2 (TWO) TIMES DAILY. 120 capsule 3  . guaiFENesin (MUCINEX) 600 MG 12 hr tablet Take 1,200 mg by mouth 2 (two) times daily as needed.    . hydrALAZINE (APRESOLINE) 100 MG tablet Take 100 mg by mouth QID.    Marland Kitchen levETIRAcetam (KEPPRA) 1000 MG tablet TAKE 1 TABLET (1,000 MG TOTAL) BY MOUTH 2 (TWO) TIMES DAILY. 180 tablet 0  . polyethylene glycol (MIRALAX / GLYCOLAX) packet Take 17 g by mouth daily.    Marland Kitchen senna (SENOKOT) 8.6 MG tablet Take 1 tablet by mouth as needed.      No facility-administered medications prior to visit.    PAST MEDICAL HISTORY: Past Medical History  Diagnosis Date  . Stroke [facility])     left brain with residual right hemiparesis and aphasia  . Seizures (HCC)   . Dyslipidemia   . Gait disturbance   . Benign hypertrophy of prostate     PAST SURGICAL HISTORY: Past Surgical History  Procedure Laterality Date  . Cholecystectomy    . Anal fissure repair    . Hernia repair      FAMILY HISTORY: Family History  Problem Relation Age of Onset  . Diabetes Mother   . Heart disease Mother   . Renal Disease Mother   . COPD Sister   . Heart disease Brother     SOCIAL HISTORY: Social History   Social History  . Marital Status: Married    Spouse Name: N/A  . Number of Children: 0  . Years of Education: 2-degree    Occupational History  . Retired    Social History Main Topics  . Smoking status: Former Smoker    Quit date: 03/23/1958  . Smokeless tobacco: Never Used  . Alcohol Use: 0.0 oz/week    0 Standard drinks or equivalent per week     Comment: Consumes alcohol on occasion  . Drug Use: No  . Sexual Activity: Not on file   Other Topics Concern  . Not on file   Social History Narrative   Patient lives at home with his wife Pricilla Loveless    Retired.   Education college          PHYSICAL EXAM  Filed Vitals:   08/15/15 1440  BP: 168/79  Pulse: 46  Height: 5\' 10"  (1.778 m)   There is no weight on file to calculate BMI.  Generalized:  Well developed, in no acute distress   Neurological examination  Mentation: Alert oriented to time, place, history taking. Follows all commands. Asphasia. Cranial nerve II-XII: Pupils were equal round reactive to light. Extraocular movements were full, visual field were full on confrontational test. Facial sensation and strength were normal. Uvula tongue midline. Head turning and shoulder shrug  were normal and symmetric. Motor: The motor testing reveals 3/5 Strength in the RUE and RLE. 5/5 strength in LUE/LLE. Good symmetric motor tone is noted throughout.  Sensory: Sensory testing is intact to soft touch on all 4 extremities but decreased on the right. No evidence of extinction is noted.  Coordination: Cerebellar testing reveals good finger-nose-finger  and heel-to-shin on the left unable to complete on the right due to weakness.   Gait and station: patient is in a wheelchair. Reflexes: Deep tendon reflexes are symmetric and normal bilaterally.   DIAGNOSTIC DATA (LABS, IMAGING, TESTING) - I reviewed patient records, labs, notes, testing and imaging myself where available.  ASSESSMENT AND PLAN 83 y.o. year old male  has a past medical history of Stroke (HCC); Seizures (HCC); Dyslipidemia; Gait disturbance; and Benign hypertrophy of prostate. here  with:  1. History of stroke 2. Right-sided hemiparesis 3. Seizures  Overall the patient has remained stable. He will continue taking Keppra 1000 mg twice a day for seizures. Patient advised that he should maintain strict control of his blood pressure with goal less than 130/90. Cholesterol LDL less than 100 and hemoglobin A1c less than 6.5%. Patient advised that if he develops any strokelike symptoms he should call 911 or go to the emergency room immediately. Patient and his wife verbalized understanding. He will follow-up in one year with Dr. Anne Hahn.   Butch Penny, MSN, NP-C 08/15/2015, 2:42 PM [facility] [address], [address] [place], [place] [address] [phone]

## 2015-11-10 ENCOUNTER — Other Ambulatory Visit: Payer: Self-pay | Admitting: Neurology

## 2016-08-11 ENCOUNTER — Telehealth: Payer: Self-pay | Admitting: *Deleted

## 2016-08-11 NOTE — Telephone Encounter (Signed)
Called and left detailed message for daughter. Advised wife not on DPR form. Cannot speak with her. I did call wife to let her know HIPPA law. Advised new form will have to be filled out. Advised medication should be managed in the hospital while patient there and should be able to get refills once discharged and can reschedule f/u appt. Asked her to call back to make sure she received message. Gave [facility] phone number.

## 2016-08-12 NOTE — Telephone Encounter (Signed)
Called wife. She will have daughter call office back. I have not heard from her.

## 2016-08-13 ENCOUNTER — Ambulatory Visit: Payer: Medicare Other | Admitting: Neurology

## 2017-01-14 ENCOUNTER — Other Ambulatory Visit: Payer: Self-pay | Admitting: Neurology

## 2017-01-14 NOTE — Telephone Encounter (Signed)
LVM for daughter to call and make appt. Has not been seen In over year.

## 2017-01-15 NOTE — Telephone Encounter (Signed)
Called daughter again. She stated she got my message and told her mother to call and make appt. I advised she was not on the DPR form, only her. That is why I called her. Advised a new DPR form can be filled out when he is seen next. She will have mother call next week to make appt. Advised I will see if Dr Anne HahnWillis okay with doing temporary refill until seen in the office. She verbalized understanding.   Pt last seen 08/15/15. Has been in and out of hospital per daughter. Daughter does not think pt out of Keppra, but not sure.

## 2017-04-23 DEATH — deceased
